# Patient Record
Sex: Male | Born: 2000 | Race: White | Hispanic: No | Marital: Single | Smoking: Never smoker
Health system: Southern US, Community
[De-identification: ages and names within clinical notes are randomized; demographics above are authoritative.]

## PROBLEM LIST (undated history)

## (undated) DIAGNOSIS — Z789 Other specified health status: Secondary | ICD-10-CM

## (undated) HISTORY — DX: Other specified health status: Z78.9

---
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
## 2022-01-05 DIAGNOSIS — M542 Cervicalgia: Secondary | ICD-10-CM | POA: Diagnosis not present

## 2022-01-05 DIAGNOSIS — M25552 Pain in left hip: Secondary | ICD-10-CM | POA: Diagnosis not present

## 2022-01-10 ENCOUNTER — Ambulatory Visit: Payer: Self-pay

## 2022-01-11 DIAGNOSIS — M542 Cervicalgia: Secondary | ICD-10-CM | POA: Diagnosis not present

## 2022-01-11 DIAGNOSIS — M25552 Pain in left hip: Secondary | ICD-10-CM | POA: Diagnosis not present

## 2022-01-13 ENCOUNTER — Ambulatory Visit: Payer: Self-pay | Admitting: Family Medicine

## 2022-01-13 ENCOUNTER — Encounter: Payer: Self-pay | Admitting: Family Medicine

## 2022-01-13 DIAGNOSIS — M7660 Achilles tendinitis, unspecified leg: Secondary | ICD-10-CM

## 2022-01-13 NOTE — Progress Notes (Signed)
  Ivan Parsons - 21 y.o. male MRN [id number]  Date of birth: Apr 18, 2001  SUBJECTIVE:  Including CC & ROS.  No chief complaint on file.   Ivan Parsons is a 21 y.o. male that is  here for shockwave therapy.    Review of Systems See HPI   HISTORY: Past Medical, Surgical, Social, and Family History Reviewed & Updated per EMR.   Pertinent Historical Findings include:  Past Medical History:  Diagnosis Date   Medical history non-contributory     Past Surgical History:  Procedure Laterality Date   LAPAROSCOPIC APPENDECTOMY N/A 01/17/2020   Procedure: APPENDECTOMY LAPAROSCOPIC;  Surgeon: Kandice Hams, MD;  Location: [facility];  Service: Pediatrics;  Laterality: N/A;     PHYSICAL EXAM:  VS: There were no vitals taken for this visit. Physical Exam Gen: NAD, alert, cooperative with exam, well-appearing MSK:  Neurovascularly intact    ECSWT Note Ivan Parsons November 23, 2000  Procedure: ECSWT Indications: right and left heel pain  Procedure Details Consent: Risks of procedure as well as the alternatives and risks of each were explained to the (patient/caregiver).  Consent for procedure obtained. Time Out: Verified patient identification, verified procedure, site/side was marked, verified correct patient position, special equipment/implants available, medications/allergies/relevent history reviewed, required imaging and test results available.  Performed.  The area was cleaned with iodine and alcohol swabs.    The right and left achilles was targeted for Extracorporeal shockwave therapy.   Preset: achillodynia Power Level: 110 and 40 Frequency: 1500 and 700 Impulse/cycles: 10 and 8  Head size: medium  Session: 5  Patient did tolerate procedure well.    ASSESSMENT & PLAN:   Insertional Achilles tendinopathy Completed shockwave therapy

## 2022-01-13 NOTE — Assessment & Plan Note (Signed)
Completed shockwave therapy  

## 2022-01-19 ENCOUNTER — Ambulatory Visit: Payer: BC Managed Care – PPO

## 2022-01-19 DIAGNOSIS — M25552 Pain in left hip: Secondary | ICD-10-CM | POA: Diagnosis not present

## 2022-01-19 DIAGNOSIS — M542 Cervicalgia: Secondary | ICD-10-CM | POA: Diagnosis not present

## 2022-01-24 ENCOUNTER — Ambulatory Visit: Payer: BC Managed Care – PPO

## 2022-01-25 DIAGNOSIS — M542 Cervicalgia: Secondary | ICD-10-CM | POA: Diagnosis not present

## 2022-01-25 DIAGNOSIS — M25552 Pain in left hip: Secondary | ICD-10-CM | POA: Diagnosis not present

## 2022-05-02 DIAGNOSIS — M25552 Pain in left hip: Secondary | ICD-10-CM | POA: Diagnosis not present

## 2022-05-02 DIAGNOSIS — M542 Cervicalgia: Secondary | ICD-10-CM | POA: Diagnosis not present

## 2022-05-31 DIAGNOSIS — M542 Cervicalgia: Secondary | ICD-10-CM | POA: Diagnosis not present

## 2022-05-31 DIAGNOSIS — M25552 Pain in left hip: Secondary | ICD-10-CM | POA: Diagnosis not present

## 2022-06-02 IMAGING — US US ABDOMEN LIMITED
1 series · 14 of 23 positions shown · non-contrast
Comparison: None.

CLINICAL DATA: Right lower quadrant pain for 1 day

EXAM:
ULTRASOUND ABDOMEN LIMITED
TECHNIQUE: Gray scale imaging of the right lower quadrant was performed to
evaluate for suspected appendicitis. Standard imaging planes and
graded compression technique were utilized.

[Series 1: us appendix (abdomen limited) · 23 acquisitions, 14 frames shown]
[im 1/23]
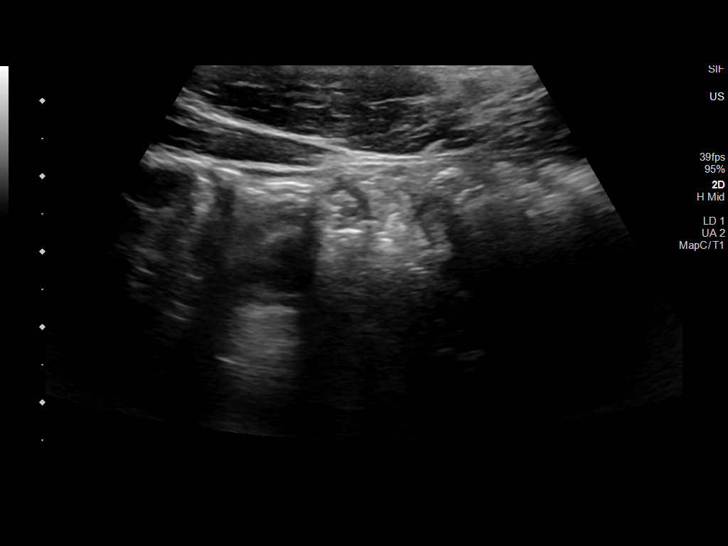
[im 3/23]
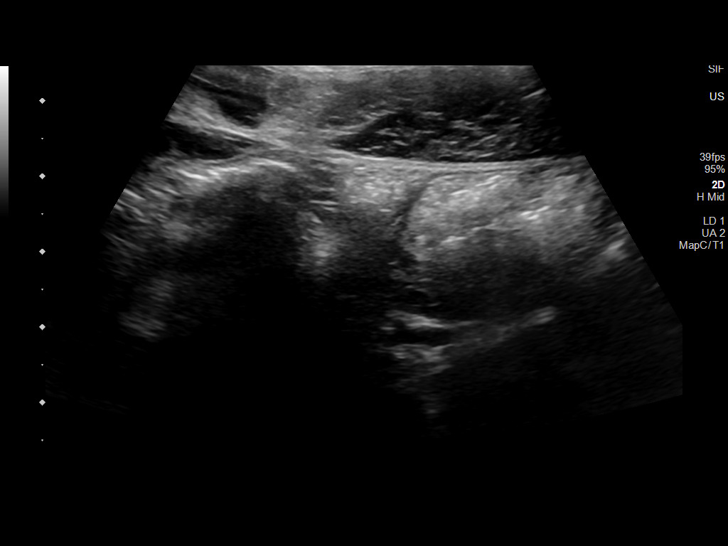
[im 5/23]
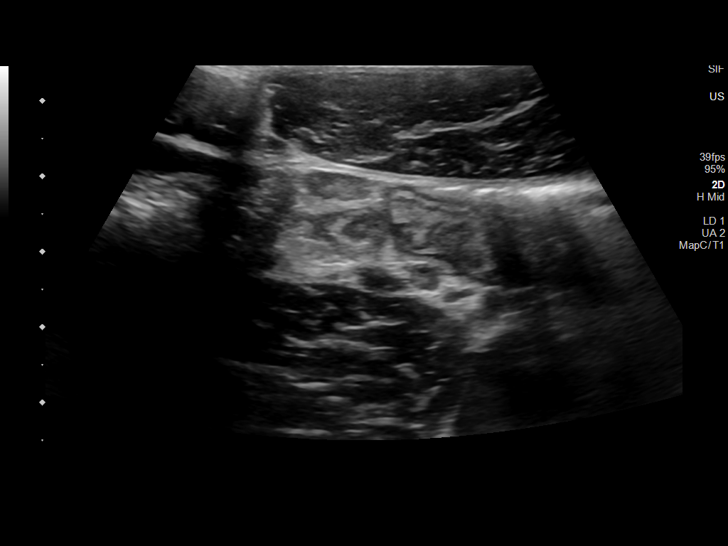
[im 6/23]
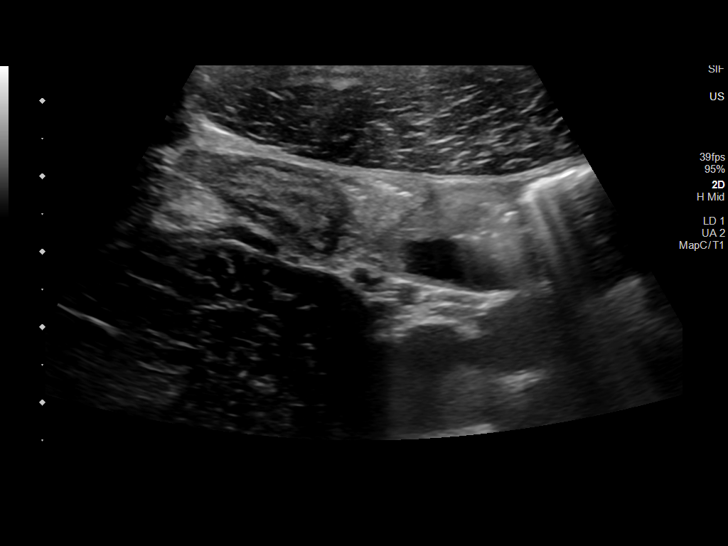
[im 8/23]
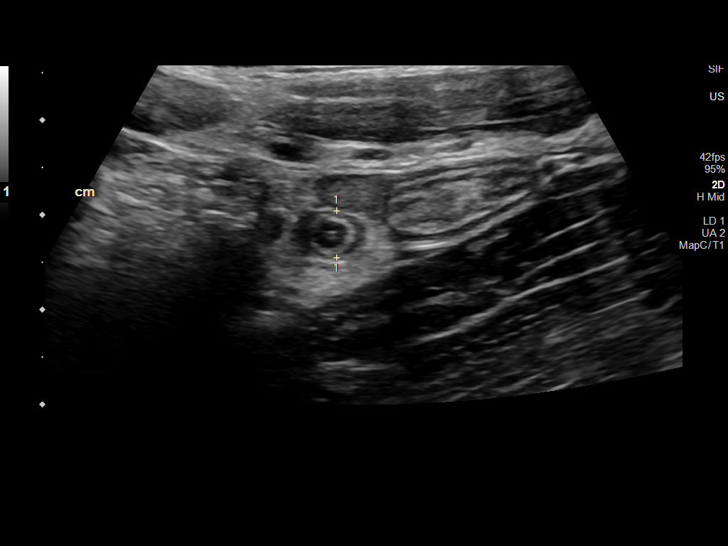
[im 10/23]
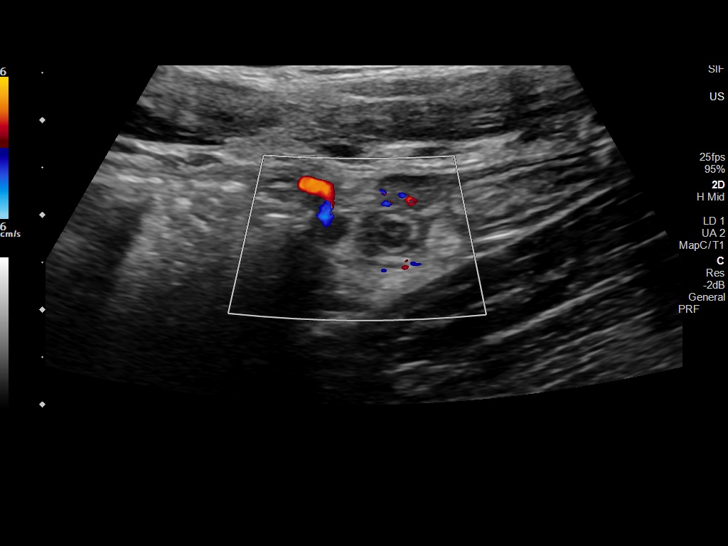
[im 11/23]
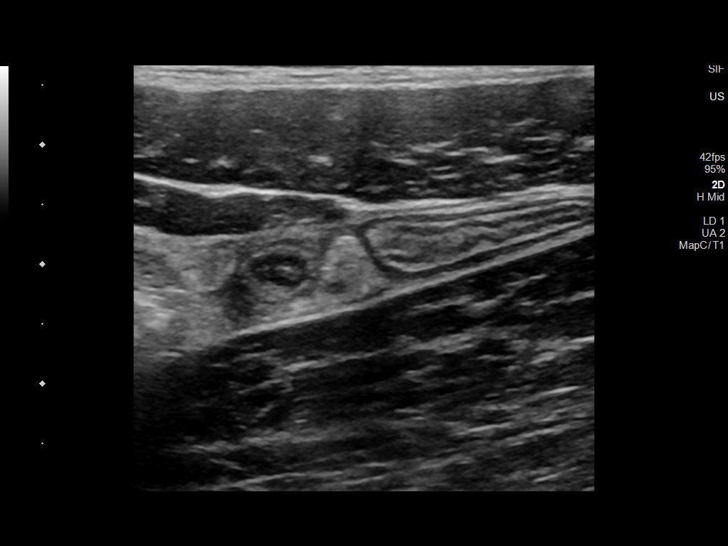
[im 13/23]
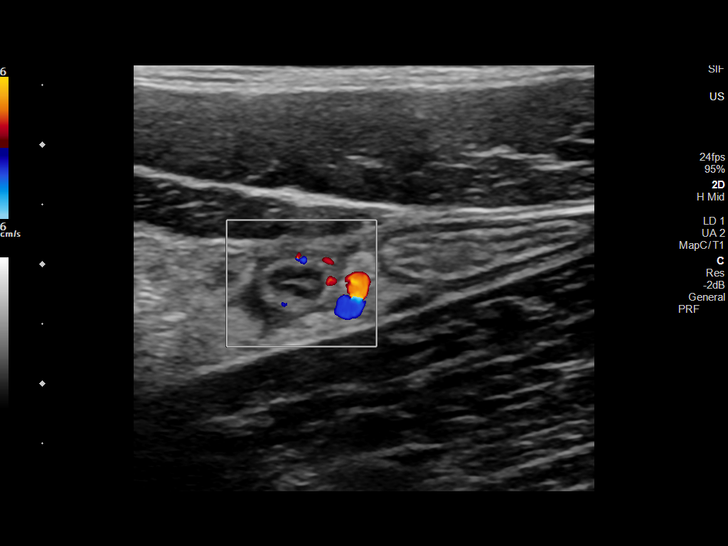
[im 14/23]
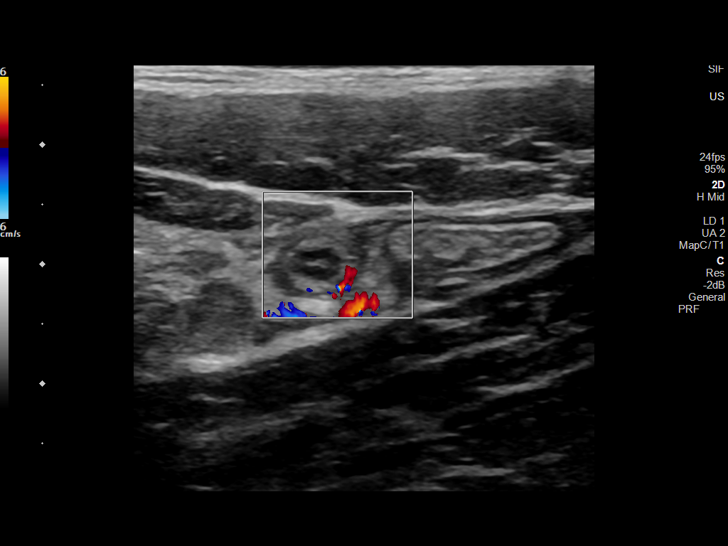
[im 16/23]
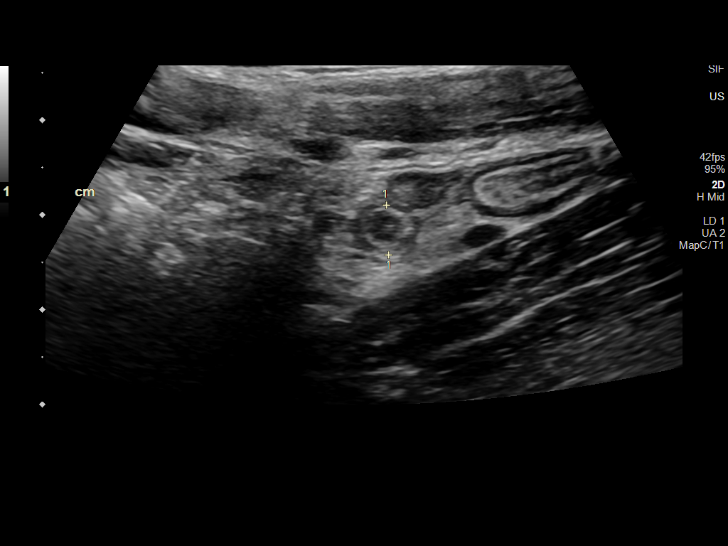
[im 18/23]
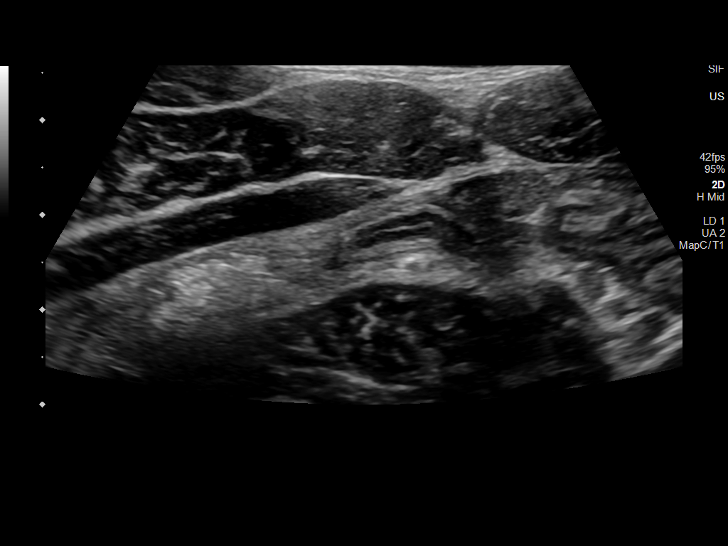
[im 19/23]
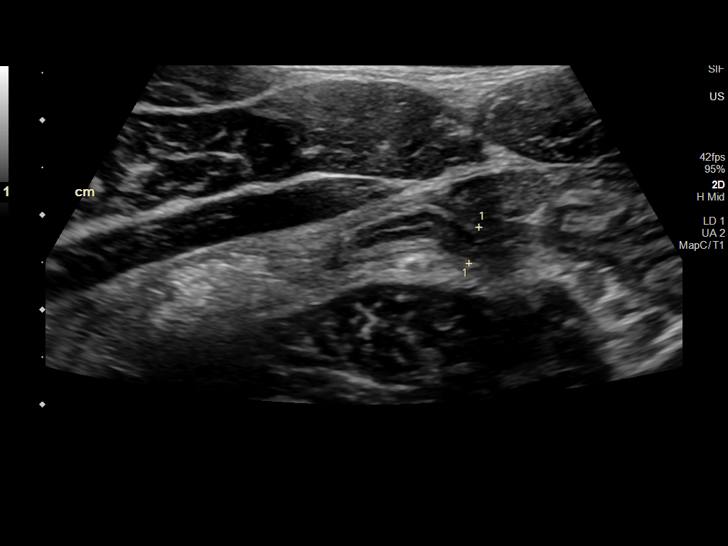
[im 21/23]
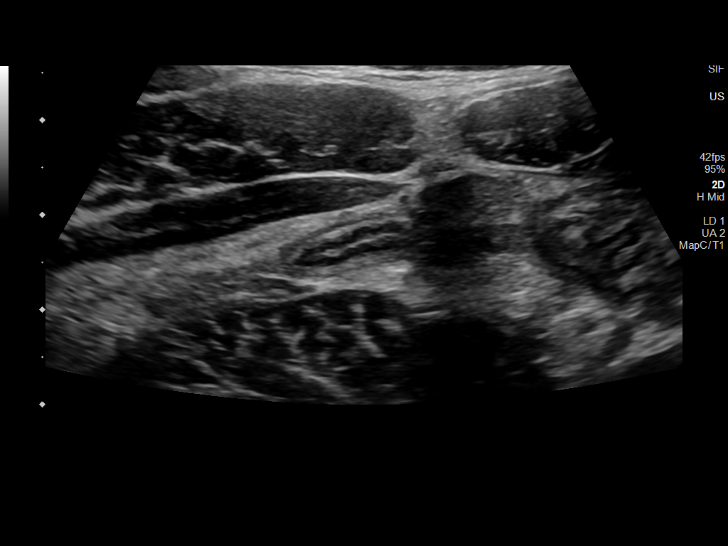
[im 23/23]
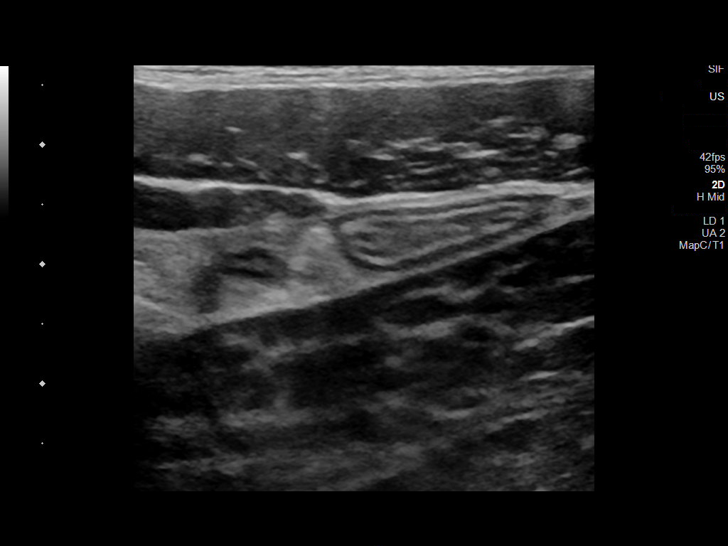

[14 of 23 positions shown; findings below may reference images not displayed]

FINDINGS: The appendix is visualized. Appendiceal diameter measures about 5 mm
which is normal. However, the appendix appears to be noncompressible
with mild wall thickening and stranding in the adjacent
periappendiceal fat. Tenderness on compression with the ultrasound
probe. This may indicate evidence of early acute appendicitis.

Ancillary findings: No appendicolith or loculated fluid collections.

Factors affecting image quality: None.

Other findings: None.
IMPRESSION: Normal caliber appendix is visualized. However, the appendix appears
to be noncompressible with mild wall thickening and stranding in the
adjacent periappendiceal fat. Tenderness on compression with the
ultrasound probe. This may indicate early acute appendicitis.

## 2022-06-16 DIAGNOSIS — M25552 Pain in left hip: Secondary | ICD-10-CM | POA: Diagnosis not present

## 2022-06-16 DIAGNOSIS — M542 Cervicalgia: Secondary | ICD-10-CM | POA: Diagnosis not present

## 2022-06-30 DIAGNOSIS — M542 Cervicalgia: Secondary | ICD-10-CM | POA: Diagnosis not present

## 2022-06-30 DIAGNOSIS — M25552 Pain in left hip: Secondary | ICD-10-CM | POA: Diagnosis not present

## 2022-08-05 DIAGNOSIS — M9901 Segmental and somatic dysfunction of cervical region: Secondary | ICD-10-CM | POA: Diagnosis not present

## 2022-08-05 DIAGNOSIS — M9902 Segmental and somatic dysfunction of thoracic region: Secondary | ICD-10-CM | POA: Diagnosis not present

## 2022-08-05 DIAGNOSIS — M9907 Segmental and somatic dysfunction of upper extremity: Secondary | ICD-10-CM | POA: Diagnosis not present

## 2022-08-05 DIAGNOSIS — M9908 Segmental and somatic dysfunction of rib cage: Secondary | ICD-10-CM | POA: Diagnosis not present

## 2022-08-05 DIAGNOSIS — M546 Pain in thoracic spine: Secondary | ICD-10-CM | POA: Diagnosis not present

## 2022-08-22 DIAGNOSIS — M25552 Pain in left hip: Secondary | ICD-10-CM | POA: Diagnosis not present

## 2022-08-22 DIAGNOSIS — M542 Cervicalgia: Secondary | ICD-10-CM | POA: Diagnosis not present

## 2022-09-23 DIAGNOSIS — R0789 Other chest pain: Secondary | ICD-10-CM | POA: Diagnosis not present

## 2022-09-23 DIAGNOSIS — R042 Hemoptysis: Secondary | ICD-10-CM | POA: Diagnosis not present

## 2022-09-23 DIAGNOSIS — M940 Chondrocostal junction syndrome [Tietze]: Secondary | ICD-10-CM | POA: Diagnosis not present

## 2022-09-29 ENCOUNTER — Encounter: Payer: Self-pay | Admitting: *Deleted

## 2022-11-14 DIAGNOSIS — M25552 Pain in left hip: Secondary | ICD-10-CM | POA: Diagnosis not present

## 2022-11-14 DIAGNOSIS — M542 Cervicalgia: Secondary | ICD-10-CM | POA: Diagnosis not present

## 2022-11-14 DIAGNOSIS — M940 Chondrocostal junction syndrome [Tietze]: Secondary | ICD-10-CM | POA: Diagnosis not present

## 2022-11-14 NOTE — Progress Notes (Unsigned)
   Rubin Payor, PhD, LAT, ATC acting as a scribe for Clementeen Graham, MD.  Ivan Parsons is a 22 y.o. male who presents to [facility] at [facility] today for chest wall pain. Pt was previously seen by Dr. Jordan Likes on 01/13/22 for Achilles tendinopathy.   Today, pt c/o chest wall pain x ***. Pt locates pain to ***  Radiates: Aggravates: Treatments tried:  Pertinent review of systems: ***  Relevant historical information: ***   Exam:  There were no vitals taken for this visit. General: Well Developed, well nourished, and in no acute distress.   MSK: ***    Lab and Radiology Results No results found for this or any previous visit (from the past 72 hour(s)). No results found.     Assessment and Plan: 22 y.o. male with ***   PDMP not reviewed this encounter. No orders of the defined types were placed in this encounter.  No orders of the defined types were placed in this encounter.    Discussed warning signs or symptoms. Please see discharge instructions. Patient expresses understanding.   ***

## 2022-11-15 ENCOUNTER — Encounter: Payer: Self-pay | Admitting: Family Medicine

## 2022-11-15 ENCOUNTER — Ambulatory Visit: Payer: BC Managed Care – PPO | Admitting: Family Medicine

## 2022-11-15 VITALS — BP 110/68 | HR 59 | Ht 69.09 in | Wt 174.8 lb

## 2022-11-15 DIAGNOSIS — R0789 Other chest pain: Secondary | ICD-10-CM

## 2022-11-15 NOTE — Patient Instructions (Signed)
Thank you for coming in today.   Proceed to echocardiogram

## 2022-11-23 DIAGNOSIS — M542 Cervicalgia: Secondary | ICD-10-CM | POA: Diagnosis not present

## 2022-11-23 DIAGNOSIS — M25552 Pain in left hip: Secondary | ICD-10-CM | POA: Diagnosis not present

## 2022-11-23 DIAGNOSIS — M940 Chondrocostal junction syndrome [Tietze]: Secondary | ICD-10-CM | POA: Diagnosis not present

## 2022-11-25 ENCOUNTER — Telehealth: Payer: Self-pay | Admitting: Family Medicine

## 2022-11-25 NOTE — Telephone Encounter (Signed)
Patient's mom called in reference to the Echocardiogram that was ordered on 6/4. She said that she had not heard from anyone about scheduling. Can you check on authorization please?

## 2022-11-25 NOTE — Telephone Encounter (Signed)
I sent a message to lisa welch for the codes that I need in order to authorize this

## 2022-11-28 NOTE — Addendum Note (Signed)
Addended by: Evon Slack on: 11/28/2022 09:16 AM   Modules accepted: Orders

## 2022-11-29 ENCOUNTER — Ambulatory Visit (HOSPITAL_COMMUNITY): Payer: BC Managed Care – PPO | Attending: Family Medicine

## 2022-11-29 DIAGNOSIS — R0789 Other chest pain: Secondary | ICD-10-CM | POA: Diagnosis not present

## 2022-11-29 LAB — ECHOCARDIOGRAM COMPLETE
Area-P 1/2: 3.28 cm2
S' Lateral: 3.5 cm

## 2022-11-30 DIAGNOSIS — M940 Chondrocostal junction syndrome [Tietze]: Secondary | ICD-10-CM | POA: Diagnosis not present

## 2022-11-30 DIAGNOSIS — M542 Cervicalgia: Secondary | ICD-10-CM | POA: Diagnosis not present

## 2022-11-30 DIAGNOSIS — M25552 Pain in left hip: Secondary | ICD-10-CM | POA: Diagnosis not present

## 2022-11-30 NOTE — Progress Notes (Signed)
Echocardiogram looks okay.  No significant abnormalities.

## 2022-12-02 NOTE — Progress Notes (Unsigned)
Rubin Payor, PhD, LAT, ATC acting as a scribe for Clementeen Graham, MD.  Ivan Parsons is a 22 y.o. male who presents to [facility] at [facility] today for f/u chest wall pain. Pt was last seen by Dr. Denyse Amass on 11/15/22 and an echocardiogram was ordered and he was advised to cont w/ PT at [facility].   Today, pt reports ***  Dx testing: 11/29/22 Echocardiogram  Pertinent review of systems: ***  Relevant historical information: ***   Exam:  There were no vitals taken for this visit. General: Well Developed, well nourished, and in no acute distress.   MSK: ***    Lab and Radiology Results No results found for this or any previous visit (from the past 72 hour(s)). ECHOCARDIOGRAM COMPLETE  Result Date: 11/29/2022    ECHOCARDIOGRAM REPORT   Patient Name:   Ivan Parsons    Date of Exam: 11/29/2022 Medical Rec #:  [id number]     Height:       69.1 in Accession #:    [id number]    Weight:       174.8 lb Date of Birth:  25-Oct-2000     BSA:          1.953 m Patient Age:    21 years      BP:           122/71 mmHg Patient Gender: M             HR:           60 bpm. Exam Location:  [address] Procedure: 2D Echo, 3D Echo, Cardiac Doppler and Color Doppler Indications:    R07.9 Chest Pain  History:        Patient has no prior history of Echocardiogram examinations.                 Family Hx of HOCM.  Sonographer:    Clearence Ped RCS Referring Phys: 2 Elyza Whitt S Maureena Dabbs IMPRESSIONS  1. Left ventricular ejection fraction, by estimation, is 55 to 60%. The left ventricle has normal function. The left ventricle has no regional wall motion abnormalities. There is mild left ventricular hypertrophy. Left ventricular diastolic parameters were normal.  2. Right ventricular systolic function is normal. The right ventricular size is mildly enlarged. There is normal pulmonary artery systolic pressure. The estimated right ventricular systolic pressure is 18.8 mmHg.  3. The mitral valve is normal in  structure. Trivial mitral valve regurgitation. No evidence of mitral stenosis.  4. The aortic valve is tricuspid. Aortic valve regurgitation is not visualized. No aortic stenosis is present.  5. The inferior vena cava is normal in size with greater than 50% respiratory variability, suggesting right atrial pressure of 3 mmHg. FINDINGS  Left Ventricle: Left ventricular ejection fraction, by estimation, is 55 to 60%. The left ventricle has normal function. The left ventricle has no regional wall motion abnormalities. The left ventricular internal cavity size was normal in size. There is  mild left ventricular hypertrophy. Left ventricular diastolic parameters were normal. Right Ventricle: The right ventricular size is mildly enlarged. No increase in right ventricular wall thickness. Right ventricular systolic function is normal. There is normal pulmonary artery systolic pressure. The tricuspid regurgitant velocity is 1.99  m/s, and with an assumed right atrial pressure of 3 mmHg, the estimated right ventricular systolic pressure is 18.8 mmHg. Left Atrium: Left atrial size was normal in size. Right Atrium: Right atrial size was normal in size. Pericardium: There is  no evidence of pericardial effusion. Mitral Valve: The mitral valve is normal in structure. Trivial mitral valve regurgitation. No evidence of mitral valve stenosis. Tricuspid Valve: The tricuspid valve is normal in structure. Tricuspid valve regurgitation is trivial. Aortic Valve: The aortic valve is tricuspid. Aortic valve regurgitation is not visualized. No aortic stenosis is present. Pulmonic Valve: The pulmonic valve was not well visualized. Pulmonic valve regurgitation is trivial. Aorta: The aortic root and ascending aorta are structurally normal, with no evidence of dilitation. Venous: The inferior vena cava is normal in size with greater than 50% respiratory variability, suggesting right atrial pressure of 3 mmHg. IAS/Shunts: The interatrial septum  was not well visualized.  LEFT VENTRICLE PLAX 2D LVIDd:         5.20 cm   Diastology LVIDs:         3.50 cm   LV e' medial:    12.70 cm/s LV PW:         1.30 cm   LV E/e' medial:  6.2 LV IVS:        0.90 cm   LV e' lateral:   13.60 cm/s LVOT diam:     2.20 cm   LV E/e' lateral: 5.8 LV SV:         69 LV SV Index:   35 LVOT Area:     3.80 cm                           3D Volume EF:                          3D EF:        58 %                          LV EDV:       210 ml                          LV ESV:       88 ml                          LV SV:        122 ml RIGHT VENTRICLE RV Basal diam:  4.60 cm RV Mid diam:    4.00 cm RV S prime:     12.40 cm/s TAPSE (M-mode): 2.3 cm RVSP:           18.8 mmHg LEFT ATRIUM             Index        RIGHT ATRIUM           Index LA diam:        4.00 cm 2.05 cm/m   RA Pressure: 3.00 mmHg LA Vol (A2C):   62.7 ml 32.11 ml/m  RA Area:     18.50 cm LA Vol (A4C):   43.0 ml 22.02 ml/m  RA Volume:   54.90 ml  28.12 ml/m LA Biplane Vol: 52.3 ml 26.78 ml/m  AORTIC VALVE LVOT Vmax:   86.20 cm/s LVOT Vmean:  59.500 cm/s LVOT VTI:    0.181 m  AORTA Ao Root diam: 3.40 cm Ao Asc diam:  2.70 cm MITRAL VALVE               TRICUSPID VALVE MV Area (PHT):  TR Peak grad:   15.8 mmHg MV Decel Time:             TR Vmax:        199.00 cm/s MV E velocity: 79.10 cm/s  Estimated RAP:  3.00 mmHg MV A velocity: 55.50 cm/s  RVSP:           18.8 mmHg MV E/A ratio:  1.43                            SHUNTS                            Systemic VTI:  0.18 m                            Systemic Diam: 2.20 cm Epifanio Lesches MD Electronically signed by Epifanio Lesches MD Signature Date/Time: 11/29/2022/3:03:55 PM    Final        Assessment and Plan: 22 y.o. male with ***   PDMP not reviewed this encounter. No orders of the defined types were placed in this encounter.  No orders of the defined types were placed in this encounter.    Discussed warning signs or symptoms. Please see  discharge instructions. Patient expresses understanding.   ***

## 2022-12-05 ENCOUNTER — Ambulatory Visit: Payer: BC Managed Care – PPO | Admitting: Family Medicine

## 2022-12-05 ENCOUNTER — Encounter: Payer: Self-pay | Admitting: Family Medicine

## 2022-12-05 VITALS — BP 102/70 | HR 73 | Ht 69.09 in | Wt 175.0 lb

## 2022-12-05 DIAGNOSIS — R0789 Other chest pain: Secondary | ICD-10-CM

## 2022-12-05 NOTE — Patient Instructions (Signed)
Thank you for coming in today.   I am happy to talk with your team doc and ATC.  Please have them text me at my cell [phone] before calling.   Email or text the PDF of your ECHO to your medical team.   Let me know if you have questions or issues.   OK to play.

## 2022-12-07 DIAGNOSIS — M940 Chondrocostal junction syndrome [Tietze]: Secondary | ICD-10-CM | POA: Diagnosis not present

## 2022-12-07 DIAGNOSIS — M25552 Pain in left hip: Secondary | ICD-10-CM | POA: Diagnosis not present

## 2022-12-07 DIAGNOSIS — M542 Cervicalgia: Secondary | ICD-10-CM | POA: Diagnosis not present

## 2022-12-13 ENCOUNTER — Other Ambulatory Visit (HOSPITAL_COMMUNITY): Payer: BC Managed Care – PPO

## 2022-12-14 DIAGNOSIS — M25552 Pain in left hip: Secondary | ICD-10-CM | POA: Diagnosis not present

## 2022-12-14 DIAGNOSIS — M542 Cervicalgia: Secondary | ICD-10-CM | POA: Diagnosis not present

## 2022-12-14 DIAGNOSIS — M940 Chondrocostal junction syndrome [Tietze]: Secondary | ICD-10-CM | POA: Diagnosis not present

## 2022-12-20 ENCOUNTER — Ambulatory Visit: Payer: BC Managed Care – PPO | Admitting: Family Medicine

## 2022-12-23 DIAGNOSIS — M25552 Pain in left hip: Secondary | ICD-10-CM | POA: Diagnosis not present

## 2022-12-23 DIAGNOSIS — M542 Cervicalgia: Secondary | ICD-10-CM | POA: Diagnosis not present

## 2022-12-23 DIAGNOSIS — M940 Chondrocostal junction syndrome [Tietze]: Secondary | ICD-10-CM | POA: Diagnosis not present

## 2023-01-13 DIAGNOSIS — M940 Chondrocostal junction syndrome [Tietze]: Secondary | ICD-10-CM | POA: Diagnosis not present

## 2023-01-13 DIAGNOSIS — M542 Cervicalgia: Secondary | ICD-10-CM | POA: Diagnosis not present

## 2023-01-13 DIAGNOSIS — M25552 Pain in left hip: Secondary | ICD-10-CM | POA: Diagnosis not present

## 2023-01-17 DIAGNOSIS — R7989 Other specified abnormal findings of blood chemistry: Secondary | ICD-10-CM | POA: Diagnosis not present

## 2023-01-23 DIAGNOSIS — M25552 Pain in left hip: Secondary | ICD-10-CM | POA: Diagnosis not present

## 2023-01-23 DIAGNOSIS — M940 Chondrocostal junction syndrome [Tietze]: Secondary | ICD-10-CM | POA: Diagnosis not present

## 2023-01-23 DIAGNOSIS — M542 Cervicalgia: Secondary | ICD-10-CM | POA: Diagnosis not present

## 2023-01-24 DIAGNOSIS — Z1339 Encounter for screening examination for other mental health and behavioral disorders: Secondary | ICD-10-CM | POA: Diagnosis not present

## 2023-01-24 DIAGNOSIS — Z1331 Encounter for screening for depression: Secondary | ICD-10-CM | POA: Diagnosis not present

## 2023-01-24 DIAGNOSIS — J4520 Mild intermittent asthma, uncomplicated: Secondary | ICD-10-CM | POA: Diagnosis not present

## 2023-01-24 DIAGNOSIS — Z0000 Encounter for general adult medical examination without abnormal findings: Secondary | ICD-10-CM | POA: Diagnosis not present

## 2023-01-24 DIAGNOSIS — R82998 Other abnormal findings in urine: Secondary | ICD-10-CM | POA: Diagnosis not present

## 2023-05-05 DIAGNOSIS — R0602 Shortness of breath: Secondary | ICD-10-CM | POA: Diagnosis not present

## 2023-05-05 DIAGNOSIS — R21 Rash and other nonspecific skin eruption: Secondary | ICD-10-CM | POA: Diagnosis not present

## 2023-05-05 DIAGNOSIS — H1045 Other chronic allergic conjunctivitis: Secondary | ICD-10-CM | POA: Diagnosis not present

## 2023-05-05 DIAGNOSIS — J301 Allergic rhinitis due to pollen: Secondary | ICD-10-CM | POA: Diagnosis not present

## 2023-05-05 DIAGNOSIS — J3089 Other allergic rhinitis: Secondary | ICD-10-CM | POA: Diagnosis not present

## 2023-05-06 DIAGNOSIS — M542 Cervicalgia: Secondary | ICD-10-CM | POA: Diagnosis not present

## 2023-05-06 DIAGNOSIS — M940 Chondrocostal junction syndrome [Tietze]: Secondary | ICD-10-CM | POA: Diagnosis not present

## 2023-05-06 DIAGNOSIS — M25552 Pain in left hip: Secondary | ICD-10-CM | POA: Diagnosis not present

## 2023-06-01 DIAGNOSIS — M542 Cervicalgia: Secondary | ICD-10-CM | POA: Diagnosis not present

## 2023-06-01 DIAGNOSIS — M25552 Pain in left hip: Secondary | ICD-10-CM | POA: Diagnosis not present

## 2023-06-01 DIAGNOSIS — M940 Chondrocostal junction syndrome [Tietze]: Secondary | ICD-10-CM | POA: Diagnosis not present

## 2023-06-10 DIAGNOSIS — M25552 Pain in left hip: Secondary | ICD-10-CM | POA: Diagnosis not present

## 2023-06-10 DIAGNOSIS — M940 Chondrocostal junction syndrome [Tietze]: Secondary | ICD-10-CM | POA: Diagnosis not present

## 2023-06-10 DIAGNOSIS — M542 Cervicalgia: Secondary | ICD-10-CM | POA: Diagnosis not present

## 2023-06-27 DIAGNOSIS — I7300 Raynaud's syndrome without gangrene: Secondary | ICD-10-CM | POA: Diagnosis not present

## 2023-06-27 DIAGNOSIS — L700 Acne vulgaris: Secondary | ICD-10-CM | POA: Diagnosis not present

## 2023-06-28 DIAGNOSIS — M25552 Pain in left hip: Secondary | ICD-10-CM | POA: Diagnosis not present

## 2023-06-28 DIAGNOSIS — M542 Cervicalgia: Secondary | ICD-10-CM | POA: Diagnosis not present

## 2023-06-28 DIAGNOSIS — M940 Chondrocostal junction syndrome [Tietze]: Secondary | ICD-10-CM | POA: Diagnosis not present

## 2023-08-20 DIAGNOSIS — L309 Dermatitis, unspecified: Secondary | ICD-10-CM | POA: Diagnosis not present

## 2024-01-09 DIAGNOSIS — S93411D Sprain of calcaneofibular ligament of right ankle, subsequent encounter: Secondary | ICD-10-CM | POA: Diagnosis not present

## 2024-01-09 DIAGNOSIS — S93411S Sprain of calcaneofibular ligament of right ankle, sequela: Secondary | ICD-10-CM | POA: Diagnosis not present

## 2024-01-15 DIAGNOSIS — H7292 Unspecified perforation of tympanic membrane, left ear: Secondary | ICD-10-CM | POA: Diagnosis not present

## 2024-01-19 DIAGNOSIS — H9212 Otorrhea, left ear: Secondary | ICD-10-CM | POA: Diagnosis not present

## 2024-01-19 DIAGNOSIS — H7292 Unspecified perforation of tympanic membrane, left ear: Secondary | ICD-10-CM | POA: Diagnosis not present

## 2024-05-21 DIAGNOSIS — M7671 Peroneal tendinitis, right leg: Secondary | ICD-10-CM | POA: Diagnosis not present

## 2024-05-21 DIAGNOSIS — M65871 Other synovitis and tenosynovitis, right ankle and foot: Secondary | ICD-10-CM | POA: Diagnosis not present

## 2024-05-27 DIAGNOSIS — J45990 Exercise induced bronchospasm: Secondary | ICD-10-CM | POA: Diagnosis not present

## 2024-05-27 DIAGNOSIS — R0602 Shortness of breath: Secondary | ICD-10-CM | POA: Diagnosis not present

## 2024-05-27 DIAGNOSIS — S86301A Unspecified injury of muscle(s) and tendon(s) of peroneal muscle group at lower leg level, right leg, initial encounter: Secondary | ICD-10-CM | POA: Diagnosis not present

## 2024-05-27 DIAGNOSIS — Z91010 Allergy to peanuts: Secondary | ICD-10-CM | POA: Diagnosis not present

## 2024-05-27 DIAGNOSIS — M7671 Peroneal tendinitis, right leg: Secondary | ICD-10-CM | POA: Diagnosis not present

## 2024-05-27 DIAGNOSIS — M25571 Pain in right ankle and joints of right foot: Secondary | ICD-10-CM | POA: Diagnosis not present

## 2024-05-27 DIAGNOSIS — J301 Allergic rhinitis due to pollen: Secondary | ICD-10-CM | POA: Diagnosis not present

## 2024-05-30 DIAGNOSIS — S86301A Unspecified injury of muscle(s) and tendon(s) of peroneal muscle group at lower leg level, right leg, initial encounter: Secondary | ICD-10-CM | POA: Diagnosis not present

## 2024-05-30 DIAGNOSIS — M7671 Peroneal tendinitis, right leg: Secondary | ICD-10-CM | POA: Diagnosis not present

## 2024-05-30 DIAGNOSIS — M25571 Pain in right ankle and joints of right foot: Secondary | ICD-10-CM | POA: Diagnosis not present
